# Patient Record
Sex: Male | Born: 1995 | Race: Black or African American | Hispanic: No | Marital: Single | Smoking: Never smoker
Health system: Southern US, Community
[De-identification: ages and names within clinical notes are randomized; demographics above are authoritative.]

## PROBLEM LIST (undated history)

## (undated) DIAGNOSIS — F909 Attention-deficit hyperactivity disorder, unspecified type: Secondary | ICD-10-CM

## (undated) DIAGNOSIS — B009 Herpesviral infection, unspecified: Secondary | ICD-10-CM

---
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
## 2016-11-15 ENCOUNTER — Emergency Department (HOSPITAL_COMMUNITY)
Admission: EM | Admit: 2016-11-15 | Discharge: 2016-11-15 | Disposition: A | Discharge status: Home / Self Care | Payer: Medicaid Other | Attending: Emergency Medicine | Admitting: Emergency Medicine

## 2016-11-15 ENCOUNTER — Encounter (HOSPITAL_COMMUNITY): Payer: Self-pay | Admitting: Emergency Medicine

## 2016-11-15 DIAGNOSIS — F909 Attention-deficit hyperactivity disorder, unspecified type: Secondary | ICD-10-CM | POA: Diagnosis not present

## 2016-11-15 DIAGNOSIS — Y33XXXA Other specified events, undetermined intent, initial encounter: Secondary | ICD-10-CM | POA: Insufficient documentation

## 2016-11-15 DIAGNOSIS — S61211A Laceration without foreign body of left index finger without damage to nail, initial encounter: Secondary | ICD-10-CM | POA: Diagnosis not present

## 2016-11-15 DIAGNOSIS — Y939 Activity, unspecified: Secondary | ICD-10-CM | POA: Insufficient documentation

## 2016-11-15 DIAGNOSIS — Y929 Unspecified place or not applicable: Secondary | ICD-10-CM | POA: Diagnosis not present

## 2016-11-15 DIAGNOSIS — Y999 Unspecified external cause status: Secondary | ICD-10-CM | POA: Insufficient documentation

## 2016-11-15 NOTE — ED Triage Notes (Signed)
Per ems, pt was opening up a can and cut his left index finger, bleeding controlled. PMS intact.

## 2016-11-15 NOTE — Discharge Instructions (Signed)
Please read attached information. If you experience any new or worsening signs or symptoms please return to the emergency room for evaluation. Please follow-up with your primary care provider or specialist as discussed.  °

## 2016-11-15 NOTE — ED Provider Notes (Signed)
[facility]-EMERGENCY DEPT Provider Note   CSN: [id number] Arrival date & time: 11/15/16  1414     History   Chief Complaint Chief Complaint  Patient presents with  . Laceration    HPI Sean Dunn is a 21 y.o. male.  HPI   21 year old male presents today with laceration over his left volar DIP.  Patient notes that he can caused a linear laceration.  He notes bleeding was controlled with direct pressure.  He reports that he did not have a Band-Aid at the house so he called 911 for transport to the emergency room.  Patient notes his last tetanus was in 2016, he denies any numbness tingling or weakness in the extremity.  No other injuries, no meds prior to arrival.  Patient has no significant past medical history.  Past Medical History:  Diagnosis Date  . ADHD (attention deficit hyperactivity disorder)     There are no active problems to display for this patient.   History reviewed. No pertinent surgical history.     Home Medications    Prior to Admission medications   Not on File    Family History No family history on file.  Social History Social History  Substance Use Topics  . Smoking status: Not on file  . Smokeless tobacco: Not on file  . Alcohol use Not on file     Allergies   Patient has no known allergies.   Review of Systems Review of Systems  All other systems reviewed and are negative.    Physical Exam Updated Vital Signs BP 109/61   Pulse 75   Temp 98.3 F (36.8 C) (Oral)   Resp 16   SpO2 96%   Physical Exam  Constitutional: He is oriented to person, place, and time. He appears well-developed and well-nourished.  HENT:  Head: Normocephalic and atraumatic.  Eyes: Conjunctivae are normal. Pupils are equal, round, and reactive to light. Right eye exhibits no discharge. Left eye exhibits no discharge. No scleral icterus.  Neck: Normal range of motion. No JVD present. No tracheal deviation present.  Pulmonary/Chest: Effort normal. No  stridor.  Musculoskeletal:  0.5 cm laceration of the left DIP volar aspect, this is not full-thickness, no tendon, vessel or neural involvement.  Full active range of motion of the DIP PIP and MCP  Neurological: He is alert and oriented to person, place, and time. Coordination normal.  Psychiatric: He has a normal mood and affect. His behavior is normal. Judgment and thought content normal.  Nursing note and vitals reviewed.    ED Treatments / Results  Labs (all labs ordered are listed, but only abnormal results are displayed) Labs Reviewed - No data to display  EKG  EKG Interpretation None       Radiology No results found.  Procedures .Marland KitchenLaceration Repair Date/Time: 11/15/2016 2:49 PM Performed by: Curlene Dolphin, Mackinley Cassaday Authorized by: Curlene Dolphin, Joelyn Lover   Consent:    Consent obtained:  Verbal   Consent given by:  Patient   Risks discussed:  Infection, pain, poor cosmetic result and poor wound healing   Alternatives discussed:  No treatment Anesthesia (see MAR for exact dosages):    Anesthesia method:  None Laceration details:    Location:  Finger   Finger location:  L index finger   Length (cm):  0.5 Repair type:    Repair type:  Simple Exploration:    Hemostasis achieved with:  Direct pressure and tourniquet   Wound exploration: wound explored through full range of motion  Wound extent: no areolar tissue violation noted, no fascia violation noted, no foreign bodies/material noted, no muscle damage noted, no nerve damage noted, no tendon damage noted, no underlying fracture noted and no vascular damage noted     Contaminated: no   Treatment:    Area cleansed with:  Soap and water   Amount of cleaning:  Standard   Irrigation solution:  Tap water   Visualized foreign bodies/material removed: no   Skin repair:    Repair method:  Tissue adhesive Approximation:    Approximation:  Close   Vermilion border: well-aligned   Post-procedure details:    Dressing:  Splint for  protection   Patient tolerance of procedure:  Tolerated well, no immediate complications   (including critical care time)   Medications Ordered in ED Medications - No data to display   Initial Impression / Assessment and Plan / ED Course  I have reviewed the triage vital signs and the nursing notes.  Pertinent labs & imaging results that were available during my care of the patient were reviewed by me and considered in my medical decision making (see chart for details).      Final Clinical Impressions(s) / ED Diagnoses   Final diagnoses:  Laceration of left index finger without foreign body without damage to nail, initial encounter    Patient presents with a laceration to his finger.  This is partial-thickness, no deep space involvement.  Tetanus is up-to-date.  Patient does not want stitches, glued applied.  Wound care instructions given return precautions given.  Patient verbalized understanding and agreement to today's plan.  New Prescriptions New Prescriptions   No medications on file     Rosalio LoudHedges, Tyrelle Raczka, PA-C 11/15/16 1451    Cardama, Amadeo GarnetPedro Eduardo, MD 11/18/16 [phone]

## 2018-07-12 ENCOUNTER — Other Ambulatory Visit: Payer: Self-pay

## 2018-07-12 ENCOUNTER — Emergency Department (HOSPITAL_COMMUNITY)
Admission: EM | Admit: 2018-07-12 | Discharge: 2018-07-12 | Disposition: A | Discharge status: Home / Self Care | Payer: Self-pay | Attending: Emergency Medicine | Admitting: Emergency Medicine

## 2018-07-12 ENCOUNTER — Encounter (HOSPITAL_COMMUNITY): Payer: Self-pay | Admitting: Emergency Medicine

## 2018-07-12 DIAGNOSIS — A6001 Herpesviral infection of penis: Secondary | ICD-10-CM | POA: Insufficient documentation

## 2018-07-12 DIAGNOSIS — Z79899 Other long term (current) drug therapy: Secondary | ICD-10-CM | POA: Insufficient documentation

## 2018-07-12 DIAGNOSIS — Z113 Encounter for screening for infections with a predominantly sexual mode of transmission: Secondary | ICD-10-CM | POA: Insufficient documentation

## 2018-07-12 LAB — GC/CHLAMYDIA PROBE AMP (~~LOC~~) NOT AT ARMC
Chlamydia: POSITIVE — AB
NEISSERIA GONORRHEA: NEGATIVE

## 2018-07-12 MED ORDER — ACYCLOVIR 400 MG PO TABS: 400.0000 mg | ORAL_TABLET | Freq: Three times a day (TID) | ORAL | 0 refills | Status: AC

## 2018-07-12 MED ORDER — ACETAMINOPHEN 500 MG PO TABS
1000.0000 mg | ORAL_TABLET | Freq: Once | ORAL | Status: AC
  Administered 2018-07-12: 1000 mg via ORAL
  Filled 2018-07-12: qty 2

## 2018-07-12 MED ORDER — ACYCLOVIR 400 MG PO TABS
400.0000 mg | ORAL_TABLET | Freq: Once | ORAL | Status: AC
  Administered 2018-07-12: 400 mg via ORAL
  Filled 2018-07-12: qty 1

## 2018-07-12 NOTE — ED Provider Notes (Signed)
[facility]-EMERGENCY DEPT Provider Note   CSN: [id number] Arrival date & time: 07/12/18  [id number]     History   Chief Complaint Chief Complaint  Patient presents with  . Exposure to STD    HPI [name] is a 23 y.o. male history of ADHD who presents to the emergency department by EMS with a chief complaint of penile rash.  The patient endorses a burning rash to his penis that first appeared 4 days ago.  He reports the area initially had blisters, but has since crusted over.  He reports constant, worsening pain since the onset of the rash.  No history of similar.  He has been treating the rash by applying Neosporin and an over-the-counter weight cream without improvement.  Reports he came to the ER tonight after he was unable to sleep secondary to the pain.  He reports he was sexually active with a new male partner 2 weeks ago and did not use a condom.  He denies penile discharge, penile or testicular swelling, fever, chills, rashes to any other part of the body, changes in vision, or hearing.  The history is provided by the patient. No language interpreter was used.    Past Medical History:  Diagnosis Date  . ADHD (attention deficit hyperactivity disorder)     There are no active problems to display for this patient.   History reviewed. No pertinent surgical history.      Home Medications    Prior to Admission medications   Medication Sig Start Date End Date Taking? Authorizing Provider  atomoxetine (STRATTERA) 80 MG capsule Take by mouth. 12/16/15  Yes [provider]  fluticasone (FLONASE) 50 MCG/ACT nasal spray USE 2 SPRAYS IN EACH NOSTRIL ONCE DAILY 09/07/15  Yes [provider]  naproxen (NAPROSYN) 500 MG tablet Take by mouth. 02/21/18  Yes [provider]  acyclovir (ZOVIRAX) 400 MG tablet Take 1 tablet (400 mg total) by mouth 3 (three) times daily for 10 days. 07/12/18 07/22/18  [name], Coral ElseMia A, PA-C    Family  History History reviewed. No pertinent family history.  Social History Social History   Tobacco Use  . Smoking status: Never Smoker  . Smokeless tobacco: Never Used  Substance Use Topics  . Alcohol use: Not Currently  . Drug use: Not Currently     Allergies   Patient has no known allergies.   Review of Systems Review of Systems  Constitutional: Negative for appetite change and fever.  Respiratory: Negative for shortness of breath.   Cardiovascular: Negative for chest pain.  Gastrointestinal: Negative for abdominal pain.  Genitourinary: Negative for dysuria.  Musculoskeletal: Negative for back pain.  Skin: Positive for rash. Negative for color change and wound.  Allergic/Immunologic: Negative for immunocompromised state.  Neurological: Negative for weakness, numbness and headaches.  Psychiatric/Behavioral: Negative for confusion.     Physical Exam Updated Vital Signs BP 118/69 (BP Location: Right Arm)   Pulse 65   Temp 97.7 F (36.5 C) (Oral)   Resp 17   Ht 5\' 9"  (1.753 m)   Wt 67.4 kg   SpO2 97%   BMI 21.94 kg/m   Physical Exam Vitals signs and nursing note reviewed.  Constitutional:      Appearance: He is well-developed.  HENT:     Head: Normocephalic.  Eyes:     Conjunctiva/sclera: Conjunctivae normal.  Neck:     Musculoskeletal: Neck supple.  Cardiovascular:     Rate and Rhythm: Normal rate and regular rhythm.  Heart sounds: No murmur.  Pulmonary:     Effort: Pulmonary effort is normal.  Abdominal:     General: There is no distension.     Palpations: Abdomen is soft.  Skin:    General: Skin is warm and dry.     Findings: Rash present.     Comments: Grouped ulcerations with overlying crusting noted at the base of the penis on the dorsum and the left lateral surface.  Tender to palpation.  Neurological:     Mental Status: He is alert.  Psychiatric:        Behavior: Behavior normal.      ED Treatments / Results  Labs (all labs ordered  are listed, but only abnormal results are displayed) Labs Reviewed  HSV CULTURE AND TYPING  GC/CHLAMYDIA PROBE AMP ([place]) NOT AT [facility]    EKG None  Radiology No results found.  Procedures Procedures (including critical care time)  Medications Ordered in ED Medications  acetaminophen (TYLENOL) tablet 1,000 mg (1,000 mg Oral Given 07/12/18 0327)  acyclovir (ZOVIRAX) tablet 400 mg (400 mg Oral Given 07/12/18 0429)     Initial Impression / Assessment and Plan / ED Course  I have reviewed the triage vital signs and the nursing notes.  Pertinent labs & imaging results that were available during my care of the patient were reviewed by me and considered in my medical decision making (see chart for details).     23 year old male presenting with a penile rash for the last 4 days.  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  He has also been swabbed for gonorrhea and chlamydia.  He declines treatment at this time.  No history of previous HSV.  First dose of acyclovir for initial treatment of HSV given in the ER.  Will discharge home with a prescription of the same.  Barrier cream has also been applied in the ER and he will be sent home with the same.  Strict return precautions given.  He is hemodynamically stable and in no acute distress.  He is safe for discharge home with outpatient follow-up at this time.  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

## 2018-07-12 NOTE — ED Notes (Signed)
Patient refused antibiotics and medication for stds. He stated he will wait on results.

## 2018-07-12 NOTE — ED Triage Notes (Signed)
Patient is brought in [facility]. Patient has had sex two weeks and devoloped sores on penis. Patient thinks he has an std.

## 2018-07-12 NOTE — ED Notes (Signed)
Bed: GY56 Expected date:  Expected time:  Means of arrival:  Comments: 27M STD

## 2018-07-12 NOTE — Discharge Instructions (Addendum)
Thank you for allowing me to care for you today in the Emergency Department.   Your rash is consistent with genital herpes.  The virus can be transmitted in many ways.  I have attached additional information's about genital herpes.  Acyclovir is an antiviral medication to treat herpes.  Your first dose was given in the ER.  Take 1 tablet by mouth every 8 hours for the next 10 days.  Keep the affected areas dry and clean. You can take Tylenol and ibuprofen for pain control. Avoid rubbing or touching blisters and sores. If you do touch blisters or sores: Wash your hands thoroughly with soap and water. Do not touch your eyes afterward. To help relieve pain or itching, you may take the following actions as directed by your health care provider: Apply a cold, wet cloth (cold compress) to affected areas 4-6 times a day. Apply a substance that protects your skin and reduces bleeding (astringent). Apply a gel that helps relieve pain around sores (lidocaine gel). Take a warm, shallow bath that cleans the genital area (sitz bath). Lidocaine gel and sitz baths are available over-the-counter at store such as [facility] and [facility].

## 2018-07-14 LAB — HSV CULTURE AND TYPING

## 2019-03-02 ENCOUNTER — Other Ambulatory Visit: Payer: Self-pay

## 2019-03-02 ENCOUNTER — Emergency Department (HOSPITAL_COMMUNITY): Payer: Self-pay

## 2019-03-02 ENCOUNTER — Encounter (HOSPITAL_COMMUNITY): Payer: Self-pay | Admitting: *Deleted

## 2019-03-02 ENCOUNTER — Emergency Department (HOSPITAL_COMMUNITY)
Admission: EM | Admit: 2019-03-02 | Discharge: 2019-03-02 | Disposition: A | Discharge status: Home / Self Care | Payer: Self-pay | Attending: Emergency Medicine | Admitting: Emergency Medicine

## 2019-03-02 DIAGNOSIS — S99921A Unspecified injury of right foot, initial encounter: Secondary | ICD-10-CM

## 2019-03-02 DIAGNOSIS — F909 Attention-deficit hyperactivity disorder, unspecified type: Secondary | ICD-10-CM | POA: Insufficient documentation

## 2019-03-02 DIAGNOSIS — X500XXA Overexertion from strenuous movement or load, initial encounter: Secondary | ICD-10-CM | POA: Insufficient documentation

## 2019-03-02 DIAGNOSIS — Y92310 Basketball court as the place of occurrence of the external cause: Secondary | ICD-10-CM | POA: Insufficient documentation

## 2019-03-02 DIAGNOSIS — Y999 Unspecified external cause status: Secondary | ICD-10-CM | POA: Insufficient documentation

## 2019-03-02 DIAGNOSIS — Y9367 Activity, basketball: Secondary | ICD-10-CM | POA: Insufficient documentation

## 2019-03-02 DIAGNOSIS — Z79891 Long term (current) use of opiate analgesic: Secondary | ICD-10-CM | POA: Insufficient documentation

## 2019-03-02 NOTE — ED Triage Notes (Signed)
Right lateral foot pain since playing basketball yesterday and he "heard a crack"

## 2019-03-02 NOTE — Discharge Instructions (Addendum)
Please read instructions below. Apply ice to your foot for 20 minutes at a time. Elevate it to help with swelling. You can take ibuprofen/advil every 6 hours as needed for pain. Schedule an appointment with the orthopedic specialist in 2 weeks for repeat x-ray and follow-up on your injury if symptoms do not improve. Return to the ER for new or concerning symptoms.

## 2019-03-02 NOTE — ED Provider Notes (Signed)
[place] EMERGENCY DEPARTMENT Provider Note   CSN: [id number] Arrival date & time: 03/02/19  1624     History   Chief Complaint Chief Complaint  Patient presents with  . Foot Pain    HPI Sean Dunn is a 23 y.o. male emergency department for pain that began yesterday while playing bascule.  He states he was wearing sandals and after he jumped, he landed in his sandal flipped sideways.  He states once he got up and put weight on his foot he felt a "crack."  He has been having pain at the base of his right fifth toe  that is worse with walking.  He has been treating with Tylenol.  No previous injuries to right foot.  No other injuries from yesterday.    The history is provided by the patient.    Past Medical History:  Diagnosis Date  . ADHD (attention deficit hyperactivity disorder)     There are no active problems to display for this patient.   History reviewed. No pertinent surgical history.      Home Medications    Prior to Admission medications   Medication Sig Start Date End Date Taking? Authorizing Provider  atomoxetine (STRATTERA) 80 MG capsule Take by mouth. 12/16/15   [provider]  fluticasone (FLONASE) 50 MCG/ACT nasal spray USE 2 SPRAYS IN EACH NOSTRIL ONCE DAILY 09/07/15   [provider]  naproxen (NAPROSYN) 500 MG tablet Take by mouth. 02/21/18   [provider]    Family History No family history on file.  Social History Social History   Tobacco Use  . Smoking status: Never Smoker  . Smokeless tobacco: Never Used  Substance Use Topics  . Alcohol use: Not Currently  . Drug use: Not Currently     Allergies   Patient has no known allergies.   Review of Systems Review of Systems  Constitutional: Negative for fever.  Musculoskeletal: Positive for arthralgias.  Skin: Negative for wound.     Physical Exam Updated Vital Signs BP 119/80 (BP Location: Right Arm)   Pulse 61   Temp 98.1 F  (36.7 C)   Resp 16   SpO2 98%   Physical Exam Vitals signs and nursing note reviewed.  Constitutional:      General: He is not in acute distress.    Appearance: He is well-developed.  HENT:     Head: Normocephalic and atraumatic.  Eyes:     Conjunctiva/sclera: Conjunctivae normal.  Cardiovascular:     Rate and Rhythm: Normal rate.  Pulmonary:     Effort: Pulmonary effort is normal.  Musculoskeletal:     Comments: There is tenderness at the right fifth MTP joint as well as the plantar aspect of the right distal lateral foot.  No obvious swelling or deformity.  No bruising.  No wounds.  Patient is able to move his toes though has pain with moving his fifth toe.  Ankle is nontender with normal range of motion.  Neurological:     Mental Status: He is alert.  Psychiatric:        Mood and Affect: Mood normal.        Behavior: Behavior normal.      ED Treatments / Results  Labs (all labs ordered are listed, but only abnormal results are displayed) Labs Reviewed - No data to display  EKG None  Radiology Dg Foot Complete Right  Result Date: 03/02/2019 CLINICAL DATA:  Lateral right foot pain EXAM: RIGHT FOOT COMPLETE -  3+ VIEW COMPARISON:  None. FINDINGS: There is no evidence of fracture or dislocation. Pes planus. There is no evidence of arthropathy or other focal bone abnormality. Soft tissues are unremarkable. IMPRESSION: No acute osseous abnormality, right foot. Electronically Signed   By: Duanne GuessNicholas  Plundo M.D.   On: 03/02/2019 17:15    Procedures Procedures (including critical care time)  Medications Ordered in ED Medications - No data to display   Initial Impression / Assessment and Plan / ED Course  I have reviewed the triage vital signs and the nursing notes.  Pertinent labs & imaging results that were available during my care of the patient were reviewed by me and considered in my medical decision making (see chart for details).        Patient with right foot  pain after landing wrong while wearing sandals and playing backslash yesterday.  Neurovascular intact.  No wounds.  X-rays negative for acute fracture.  Discussed symptomatic management and outpatient follow-up if symptoms do not improve.  NSAIDs, RICE therapy.  Patient agreeable to plan and safe for discharge.  Discussed results, findings, treatment and follow up. Patient advised of return precautions. Patient verbalized understanding and agreed with plan.   Final Clinical Impressions(s) / ED Diagnoses   Final diagnoses:  Injury of right foot, initial encounter    ED Discharge Orders    None       Leiby Pigeon, [place]il[place]Jordan N, PA-C 03/02/19 1723    Blane OharaZavitz, Joshua, MD 03/03/19 [phone]

## 2020-04-30 IMAGING — CR DG FOOT COMPLETE 3+V*R*
3 series · 3 of 3 positions shown · non-contrast
Comparison: None.

CLINICAL DATA: Lateral right foot pain

EXAM:
RIGHT FOOT COMPLETE - 3+ VIEW

[foot ap]
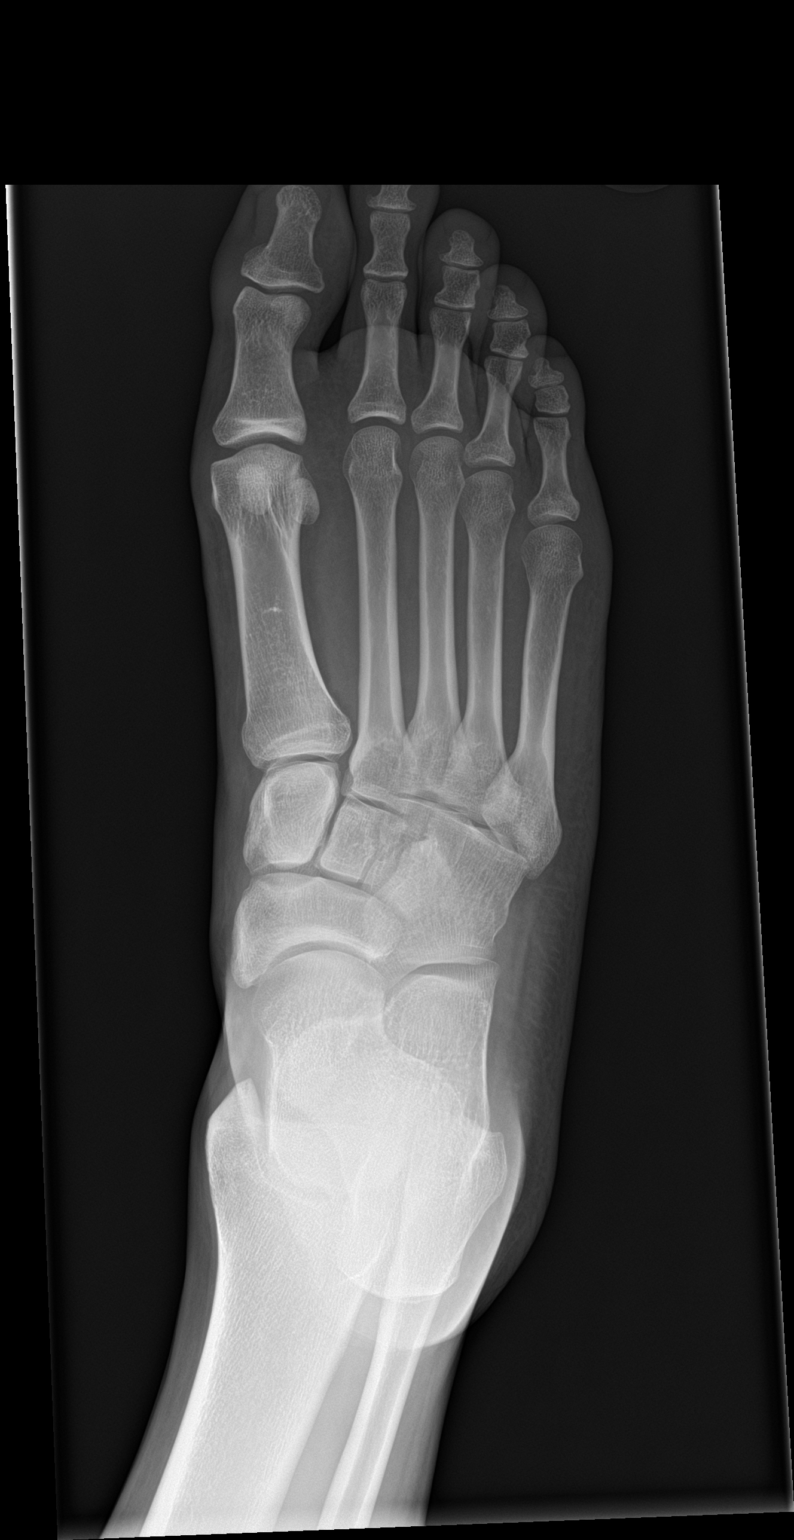

[foot obl]
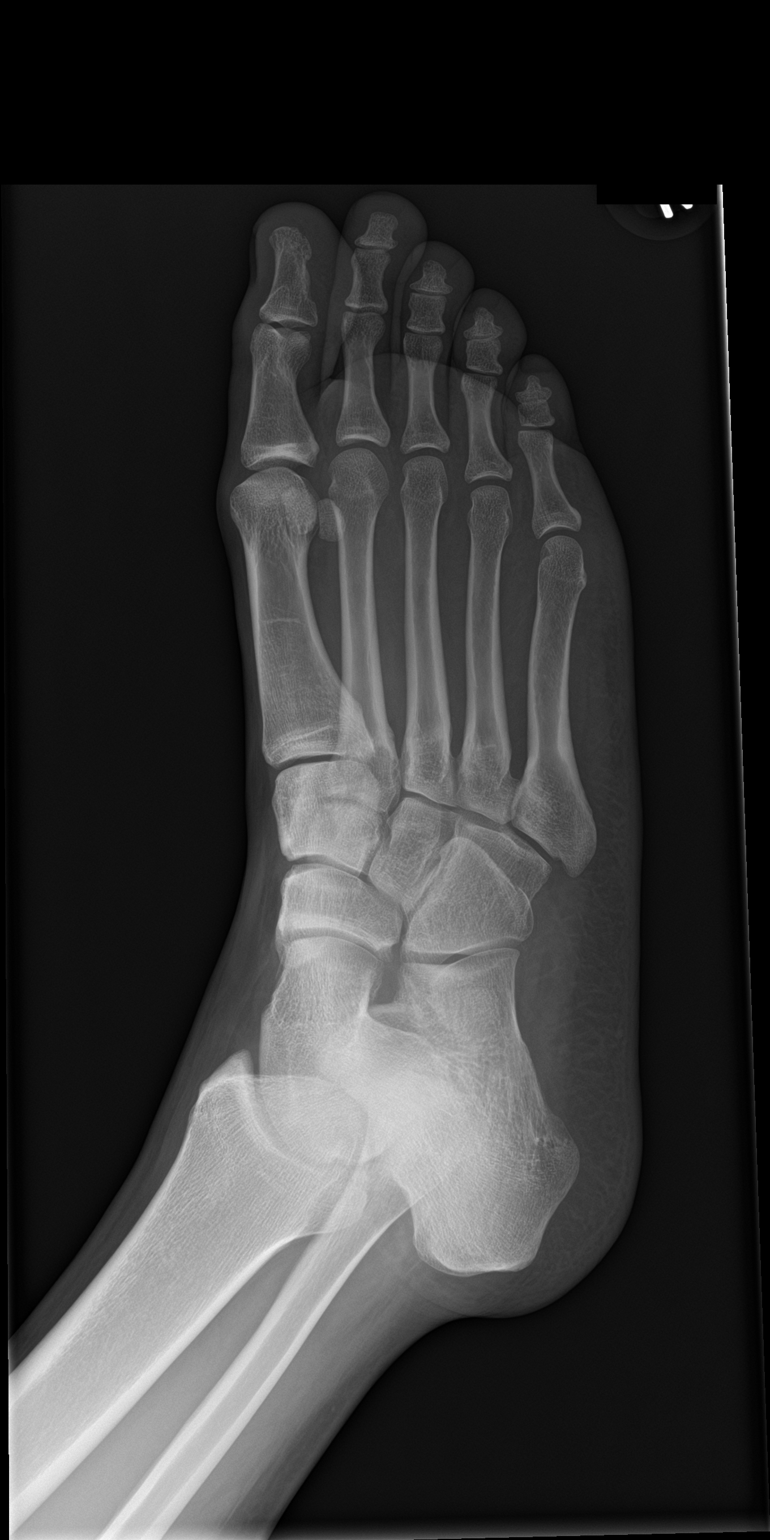

[foot lat]
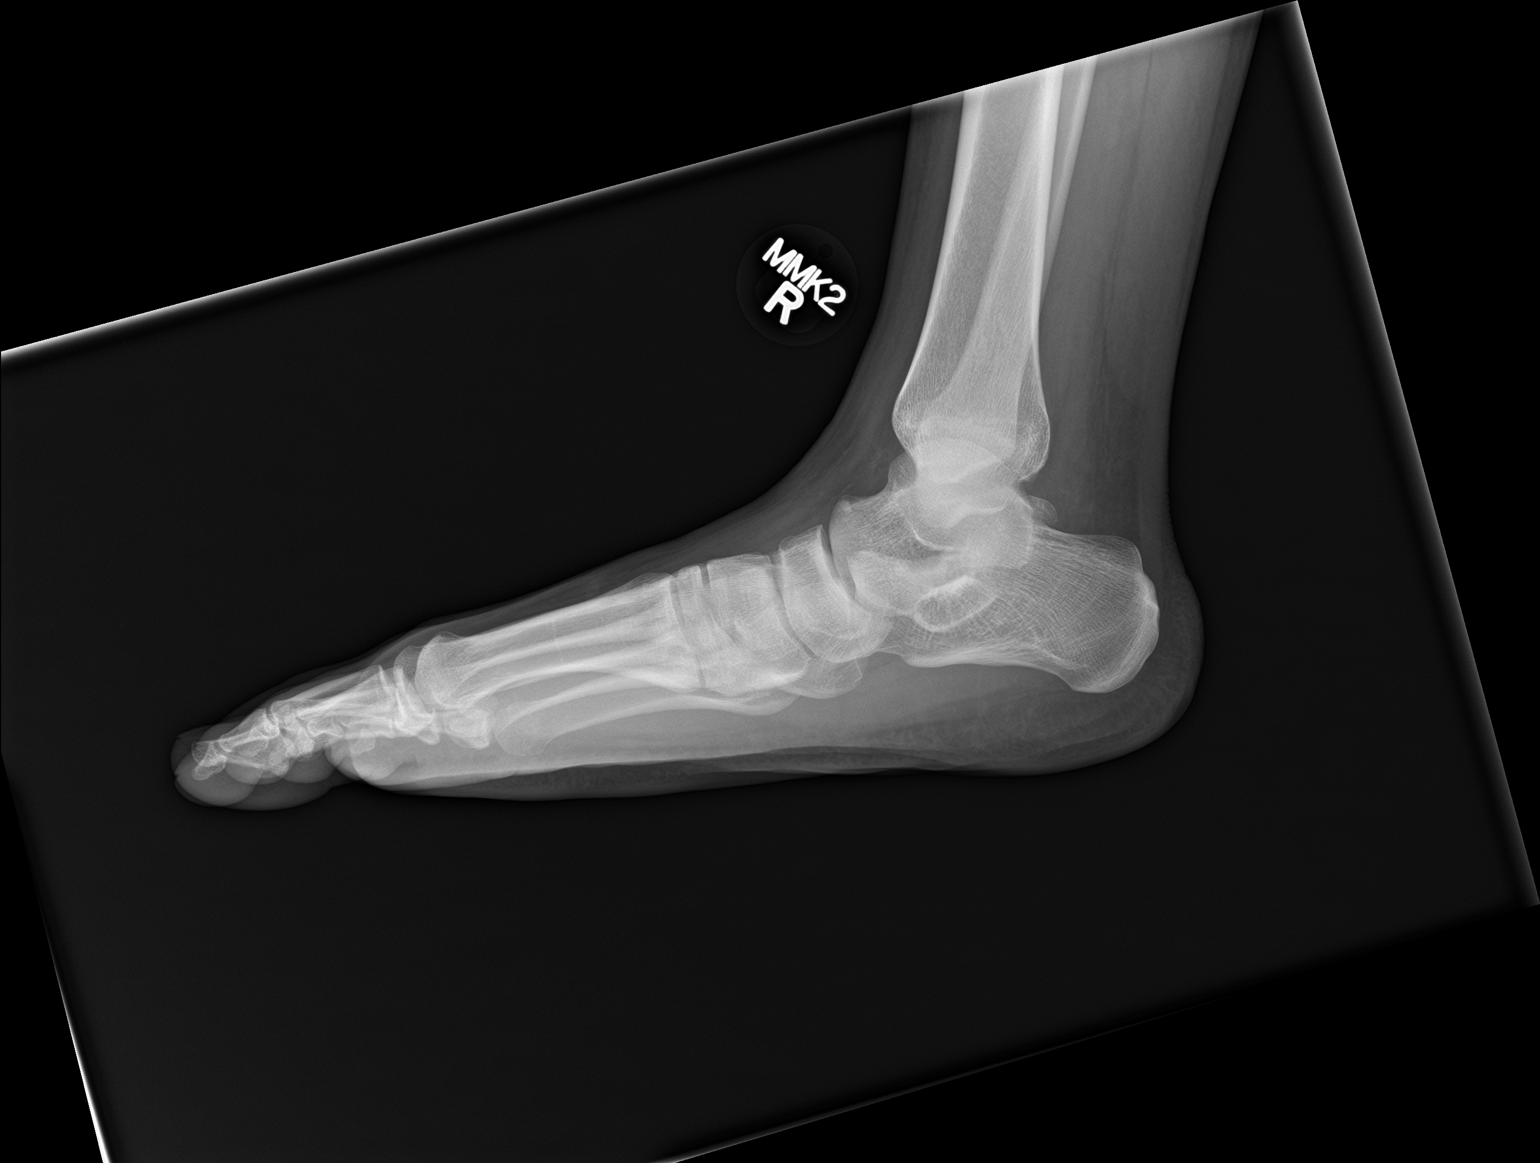

[3 of 3 positions shown; findings below may reference images not displayed]

FINDINGS: There is no evidence of fracture or dislocation. Pes planus. There
is no evidence of arthropathy or other focal bone abnormality. Soft
tissues are unremarkable.
IMPRESSION: No acute osseous abnormality, right foot.

## 2022-10-12 ENCOUNTER — Emergency Department (HOSPITAL_COMMUNITY)
Admission: EM | Admit: 2022-10-12 | Discharge: 2022-10-12 | Discharge status: Against Medical Advice | Payer: Medicaid Other | Attending: Emergency Medicine | Admitting: Emergency Medicine

## 2022-10-12 ENCOUNTER — Encounter (HOSPITAL_COMMUNITY): Payer: Self-pay

## 2022-10-12 ENCOUNTER — Other Ambulatory Visit: Payer: Self-pay

## 2022-10-12 DIAGNOSIS — Z5329 Procedure and treatment not carried out because of patient's decision for other reasons: Secondary | ICD-10-CM | POA: Insufficient documentation

## 2022-10-12 DIAGNOSIS — U071 COVID-19: Secondary | ICD-10-CM | POA: Insufficient documentation

## 2022-10-12 DIAGNOSIS — F17200 Nicotine dependence, unspecified, uncomplicated: Secondary | ICD-10-CM | POA: Insufficient documentation

## 2022-10-12 DIAGNOSIS — R001 Bradycardia, unspecified: Secondary | ICD-10-CM | POA: Insufficient documentation

## 2022-10-12 HISTORY — DX: Herpesviral infection, unspecified: B00.9

## 2022-10-12 LAB — COMPREHENSIVE METABOLIC PANEL
ALT: 24 U/L (ref 0–44)
AST: 29 U/L (ref 15–41)
Albumin: 3.4 g/dL — ABNORMAL LOW (ref 3.5–5.0)
Alkaline Phosphatase: 30 U/L — ABNORMAL LOW (ref 38–126)
Anion gap: 15 (ref 5–15)
BUN: 9 mg/dL (ref 6–20)
CO2: 19 mmol/L — ABNORMAL LOW (ref 22–32)
Calcium: 9.2 mg/dL (ref 8.9–10.3)
Chloride: 105 mmol/L (ref 98–111)
Creatinine, Ser: 1.2 mg/dL (ref 0.61–1.24)
GFR, Estimated: >60 mL/min (ref >60)
Glucose, Bld: 109 mg/dL — ABNORMAL HIGH (ref 70–99)
Potassium: 3.7 mmol/L (ref 3.5–5.1)
Sodium: 139 mmol/L (ref 135–145)
Total Bilirubin: 0.7 mg/dL (ref 0.3–1.2)
Total Protein: 5.6 g/dL — ABNORMAL LOW (ref 6.5–8.1)

## 2022-10-12 LAB — CBC WITH DIFFERENTIAL/PLATELET
Abs Immature Granulocytes: 0.04 10*3/uL (ref 0.00–0.07)
Basophils Absolute: 0 10*3/uL (ref 0.0–0.1)
Basophils Relative: 0 %
Eosinophils Absolute: 0.3 10*3/uL (ref 0.0–0.5)
Eosinophils Relative: 4 %
HCT: 49.5 % (ref 39.0–52.0)
Hemoglobin: 17.1 g/dL — ABNORMAL HIGH (ref 13.0–17.0)
Immature Granulocytes: 1 %
Lymphocytes Relative: 27 %
Lymphs Abs: 2 10*3/uL (ref 0.7–4.0)
MCH: 28.9 pg (ref 26.0–34.0)
MCHC: 34.5 g/dL (ref 30.0–36.0)
MCV: 83.6 fL (ref 80.0–100.0)
Monocytes Absolute: 0.4 10*3/uL (ref 0.1–1.0)
Monocytes Relative: 6 %
Neutro Abs: 4.5 10*3/uL (ref 1.7–7.7)
Neutrophils Relative %: 62 %
Platelets: 200 10*3/uL (ref 150–400)
RBC: 5.92 MIL/uL — ABNORMAL HIGH (ref 4.22–5.81)
RDW: 12.7 % (ref 11.5–15.5)
WBC: 7.3 10*3/uL (ref 4.0–10.5)
nRBC: 0 % (ref 0.0–0.2)

## 2022-10-12 LAB — LIPASE, BLOOD: Lipase: 94 U/L — ABNORMAL HIGH (ref 11–51)

## 2022-10-12 LAB — SARS CORONAVIRUS 2 BY RT PCR: SARS Coronavirus 2 by RT PCR: POSITIVE — AB

## 2022-10-12 LAB — TROPONIN I (HIGH SENSITIVITY): Troponin I (High Sensitivity): 3 ng/L (ref <18)

## 2022-10-12 MED ORDER — SODIUM CHLORIDE 0.9 % IV BOLUS
1000.0000 mL | Freq: Once | INTRAVENOUS | Status: AC
  Administered 2022-10-12: 1000 mL via INTRAVENOUS

## 2022-10-12 MED ORDER — PROCHLORPERAZINE EDISYLATE 10 MG/2ML IJ SOLN: 10.0000 mg | Freq: Once | INTRAMUSCULAR | Status: DC

## 2022-10-12 MED ORDER — ONDANSETRON HCL 4 MG/2ML IJ SOLN
4.0000 mg | Freq: Once | INTRAMUSCULAR | Status: AC
  Administered 2022-10-12: 4 mg via INTRAVENOUS
  Filled 2022-10-12: qty 2

## 2022-10-12 NOTE — ED Provider Notes (Signed)
[facility] EMERGENCY DEPARTMENT AT [facility] Provider Note   CSN: [id number] Arrival date & time: 10/12/22  1038     History  Chief Complaint  Patient presents with   Abdominal Pain   Nausea   Chills   Emesis    Sean Dunn is a 27 y.o. male.  27 year old male with a past medical history of ADHD presents to the ED via EMS with a chief complaint of diarrhea, nausea, vomiting which began this morning.  Patient reports cooking some noodles that he bought from the store, episodes of vomiting began upon waking up this morning.  Endorsing chills feeling hot and cold.  He did donate plasma yesterday, but has done this previously in the past without any complications. He endorses pain along his entire abdomen, worsened with vomiting. He also reports feeling dizzy, every time he turns his head he gets nauseated.  Denies any sick contacts, no fever but endorsing chills, no hematemesis.     The history is provided by the patient and medical records.  Abdominal Pain Pain location:  Generalized Pain quality: bloating and cramping   Associated symptoms: chills, diarrhea, nausea and vomiting   Associated symptoms: no chest pain, no fever, no shortness of breath and no sore throat   Emesis Associated symptoms: abdominal pain, chills and diarrhea   Associated symptoms: no fever and no sore throat        Home Medications Prior to Admission medications   Medication Sig Start Date End Date Taking? Authorizing Provider  atomoxetine (STRATTERA) 80 MG capsule Take by mouth. 12/16/15   [provider]  fluticasone (FLONASE) 50 MCG/ACT nasal spray USE 2 SPRAYS IN EACH NOSTRIL ONCE DAILY 09/07/15   [provider]  naproxen (NAPROSYN) 500 MG tablet Take by mouth. 02/21/18   [provider]      Allergies    Patient has no known allergies.    Review of Systems   Review of Systems  Constitutional:  Positive for chills. Negative for fever.  HENT:  Negative  for sore throat.   Respiratory:  Negative for shortness of breath.   Cardiovascular:  Negative for chest pain.  Gastrointestinal:  Positive for abdominal pain, diarrhea, nausea and vomiting.  Genitourinary:  Negative for flank pain.  Musculoskeletal:  Negative for back pain.  All other systems reviewed and are negative.   Physical Exam Updated Vital Signs BP 131/77 (BP Location: Left Arm)   Pulse (!) 46   Temp (!) 96.6 F (35.9 C) (Axillary)   Resp 20   Ht 5\' 9"  (1.753 m)   Wt 74.8 kg   SpO2 99%   BMI 24.37 kg/m  Physical Exam Vitals and nursing note reviewed.  Constitutional:      Appearance: He is ill-appearing.  HENT:     Head: Normocephalic and atraumatic.  Eyes:     General: No scleral icterus.    Pupils: Pupils are equal, round, and reactive to light.  Cardiovascular:     Rate and Rhythm: Bradycardia present.     Heart sounds: Normal heart sounds.  Pulmonary:     Effort: Pulmonary effort is normal.     Breath sounds: Normal breath sounds. No wheezing.  Chest:     Chest wall: No tenderness.  Abdominal:     General: Bowel sounds are normal. There is no distension.     Palpations: Abdomen is soft.     Tenderness: There is generalized abdominal tenderness. There is no right CVA tenderness or left  CVA tenderness.  Musculoskeletal:        General: No tenderness or deformity.     Cervical back: Normal range of motion.  Skin:    General: Skin is warm and dry.  Neurological:     Mental Status: He is alert and oriented to person, place, and time.     ED Results / Procedures / Treatments   Labs (all labs ordered are listed, but only abnormal results are displayed) Labs Reviewed  SARS CORONAVIRUS 2 BY RT PCR - Abnormal; Notable for the following components:      Result Value   SARS Coronavirus 2 by RT PCR POSITIVE (*)    All other components within normal limits  CBC WITH DIFFERENTIAL/PLATELET - Abnormal; Notable for the following components:   RBC 5.92 (*)     Hemoglobin 17.1 (*)    All other components within normal limits  COMPREHENSIVE METABOLIC PANEL - Abnormal; Notable for the following components:   CO2 19 (*)    Glucose, Bld 109 (*)    Total Protein 5.6 (*)    Albumin 3.4 (*)    Alkaline Phosphatase 30 (*)    All other components within normal limits  LIPASE, BLOOD - Abnormal; Notable for the following components:   Lipase 94 (*)    All other components within normal limits  URINALYSIS, ROUTINE W REFLEX MICROSCOPIC  TROPONIN I (HIGH SENSITIVITY)  TROPONIN I (HIGH SENSITIVITY)    EKG EKG Interpretation  Date/Time:  Thursday Oct 12 2022 11:14:27 EDT Ventricular Rate:  54 PR Interval:  111 QRS Duration: 105 QT Interval:  401 QTC Calculation: 380 R Axis:   88 Text Interpretation: Sinus rhythm Borderline short PR interval RSR' in V1 or V2, probably normal variant Probable left ventricular hypertrophy No old tracing to compare Confirmed by Meridee Score [phone]) on 10/12/2022 11:18:19 AM  Radiology No results found.  Procedures Procedures    Medications Ordered in ED Medications  sodium chloride 0.9 % bolus 1,000 mL (0 mLs Intravenous Stopped 10/12/22 1331)  ondansetron (ZOFRAN) injection 4 mg (4 mg Intravenous Given 10/12/22 1125)    ED Course/ Medical Decision Making/ A&P Clinical Course as of 10/12/22 1334  Thu Oct 12, 2022  1304 SARS Coronavirus 2 by RT PCR(!): POSITIVE [JS]    Clinical Course User Index [JS] Claude Manges, PA-C                             Medical Decision Making Amount and/or Complexity of Data Reviewed Labs: ordered. Decision-making details documented in ED Course.  Risk Prescription drug management.     This patient presents to the ED for concern of abdominal pain, nausea, vomiting, diarrhea, this involves a number of treatment options, and is a complaint that carries with it a high risk of complications and morbidity.  The differential diagnosis includes sepsis, enteritis, versus    Co  morbidities: Discussed in HPI   Brief History:  Patient here status post plasma donation with nausea, vomiting, abdominal pain bradycardic on arrival with a heart rate in the 40s.  Currently on no medication.  Prior history of donation of plasma without any incidents reported.  Feeling overall nauseated and like "ate something bad ".  Does endorse daily marijuana use.  Last smoked last night.  EMR reviewed including pt PMHx, past surgical history and past visits to ER.   See HPI for more details   Lab Tests:  I ordered and independently  interpreted labs.  The pertinent results include:    I personally reviewed all laboratory work and imaging. Metabolic panel without any acute abnormality specifically kidney function within normal limits and no significant electrolyte abnormalities. CBC without leukocytosis or significant anemia.  Imaging Studies:  No imaging studies ordered for this patient    Cardiac Monitoring:  The patient was maintained on a cardiac monitor.  I personally viewed and interpreted the cardiac monitored which showed an underlying rhythm of: EKG non-ischemic   Medicines ordered:  I ordered medication including bolus,zofran  for symptomatic treatment Reevaluation of the patient after these medicines showed that the patient improved I have reviewed the patients home medicines and have made adjustments as needed  Reevaluation:  After the interventions noted above I re-evaluated patient and found that they have : unable to reassess as patient eloped from the ED.    Social Determinants of Health:  The patient's social determinants of health were a factor in the care of this patient  Problem List / ED Course:  Patient presents to the ED with a chief complaint of nausea, vomiting, diarrhea which began this morning.  Reports some changes in oral intake yesterday with noodles that he bought from the store and prepared himself.  Feels that he is got some sort of  viral illness.  He did donate plasma yesterday, has done this in the past and reports no incidents from previous donations.  His blood work here is normal, however he arrived to the ED bradycardic, normotensive, with a low temp as he endorses chills along with no fever at home.  He is COVID-positive on today's visit.  He received fluids, Zofran to help with symptomatic treatment, upon reassessing patient I was notified that he eloped from the emergency department AGAINST MEDICAL ADVICE.  Sadly we are unable to finish his workup as he has left the ED.  He was obtained when he was here, some ST changes noted, however no prior for comparison and troponin was pending when patient walked out of the emergency department.  I was informed by nursing staff that he had removed his IV and walked out by himself.   Dispostion:  Patient has ELOPED from emergency department.    Portions of this note were generated with Scientist, clinical (histocompatibility and immunogenetics). Dictation errors may occur despite best attempts at proofreading.   Final Clinical Impression(s) / ED Diagnoses Final diagnoses:  COVID-19 virus infection  Bradycardia    Rx / DC Orders ED Discharge Orders     None         Claude Manges, Cordelia Poche 10/12/22 1337    Terrilee Files, MD 10/12/22 1758

## 2022-10-12 NOTE — ED Triage Notes (Signed)
Nausea/vomiting started today. Multiple bouts of emesis in ambulance. Feeling hot/cold. Patient donated plasma yesterday. EMS reports heart rate of 47

## 2022-10-12 NOTE — ED Notes (Signed)
Patient pulled his own IV and left.

## 2022-10-12 NOTE — ED Notes (Signed)
Pt left at 1:24 pm and removed his own Iv
